# Patient Record
Sex: Male | Born: 1985 | Race: White | Hispanic: No | Marital: Married | Smoking: Former smoker
Health system: Southern US, Community
[De-identification: ages and names within clinical notes are randomized; demographics above are authoritative.]

## PROBLEM LIST (undated history)

## (undated) DIAGNOSIS — N200 Calculus of kidney: Secondary | ICD-10-CM

## (undated) HISTORY — PX: WRIST SURGERY: SHX841

## (undated) HISTORY — DX: Calculus of kidney: N20.0

---

## 2014-01-12 DIAGNOSIS — F411 Generalized anxiety disorder: Secondary | ICD-10-CM | POA: Insufficient documentation

## 2014-10-03 DIAGNOSIS — Z87891 Personal history of nicotine dependence: Secondary | ICD-10-CM | POA: Insufficient documentation

## 2017-08-16 ENCOUNTER — Ambulatory Visit (INDEPENDENT_AMBULATORY_CARE_PROVIDER_SITE_OTHER): Payer: 59 | Admitting: Physician Assistant

## 2017-08-16 ENCOUNTER — Encounter: Payer: Self-pay | Admitting: Physician Assistant

## 2017-08-16 VITALS — BP 128/80 | HR 76 | Temp 98.7°F | Resp 16 | Ht 71.5 in | Wt 248.0 lb

## 2017-08-16 DIAGNOSIS — R221 Localized swelling, mass and lump, neck: Secondary | ICD-10-CM | POA: Diagnosis not present

## 2017-08-16 DIAGNOSIS — Z0000 Encounter for general adult medical examination without abnormal findings: Secondary | ICD-10-CM | POA: Diagnosis not present

## 2017-08-16 DIAGNOSIS — Z23 Encounter for immunization: Secondary | ICD-10-CM | POA: Diagnosis not present

## 2017-08-16 DIAGNOSIS — F411 Generalized anxiety disorder: Secondary | ICD-10-CM | POA: Diagnosis not present

## 2017-08-16 DIAGNOSIS — Z13220 Encounter for screening for lipoid disorders: Secondary | ICD-10-CM | POA: Diagnosis not present

## 2017-08-16 DIAGNOSIS — G8929 Other chronic pain: Secondary | ICD-10-CM | POA: Diagnosis not present

## 2017-08-16 DIAGNOSIS — Z131 Encounter for screening for diabetes mellitus: Secondary | ICD-10-CM

## 2017-08-16 DIAGNOSIS — F17200 Nicotine dependence, unspecified, uncomplicated: Secondary | ICD-10-CM

## 2017-08-16 DIAGNOSIS — Z1329 Encounter for screening for other suspected endocrine disorder: Secondary | ICD-10-CM | POA: Diagnosis not present

## 2017-08-16 DIAGNOSIS — M5440 Lumbago with sciatica, unspecified side: Secondary | ICD-10-CM

## 2017-08-16 MED ORDER — BUPROPION HCL ER (SR) 150 MG PO TB12: ORAL_TABLET | ORAL | 2 refills | Status: DC

## 2017-08-16 NOTE — Patient Instructions (Signed)

## 2017-08-16 NOTE — Progress Notes (Signed)
Patient: Gregory Wells Male    DOB: 23-Jul-1986   31 y.o.   MRN: [id number] Visit Date: 08/16/2017  Today's Provider: Trey Sailors, PA-C   Chief Complaint  Patient presents with  . Establish Care  . Cyst    Right side of neck  . Back Pain    Lower back; worse in the morning. Recent flare about two weeks ago.     Subjective:    Gregory Wells is a 31 y/o man presenting today to establish care. He has not been seen by a PCP.   He lives in [place] with his wife and three children. He works in [employer] and travels frequently for his work. He smokes 8-13 cigarettes per day, has since he was 13. Has quit multiple times in the past, most recently 9 months ago. He uses alcohol. He does not do drugs.   He has had episodes of back pain twice, once every four months. He was working and felt his back kink and felt extreme pain that shot down his legs. It lasted for a while and then subsided. Worse in the mornings and improves with movement. Had normal Xrays per patient two years ago with previous PCP.  Also has noticed cyst on the right side of his neck within the past month. Does not change in size, has not gotten red. It's not painful to touch but he says he'll occasionally feel pain when turning his head. No problems with his arms.   Has been starting to exercise, going to the gym and walking.   Has a history of anxiety that he copes with by smoking. He is interested in quitting smoking and then going on longterm anxiety medication.   Back Pain  This is a recurrent problem. The current episode started more than 1 month ago (First one was in Feb most recently two weeks ago). The problem is unchanged. The pain is present in the lumbar spine. The quality of the pain is described as cramping. Worse during: Worse in the mornings. The symptoms are aggravated by lying down. Stiffness is present in the morning. Pertinent negatives include no abdominal pain, bladder incontinence, bowel  incontinence, chest pain, dysuria, fever, headaches, leg pain, numbness, paresis, paresthesias, pelvic pain, perianal numbness, tingling, weakness or weight loss. He has tried ice and home exercises for the symptoms. The treatment provided no relief.       Allergies  Allergen Reactions  . Penicillins     Other reaction(s): VOMITING    No current outpatient prescriptions on file.  Review of Systems  Constitutional: Negative for fever and weight loss.  Cardiovascular: Negative for chest pain.  Gastrointestinal: Negative for abdominal pain and bowel incontinence.  Genitourinary: Negative for bladder incontinence, dysuria and pelvic pain.  Musculoskeletal: Positive for back pain.  Neurological: Negative for tingling, weakness, numbness, headaches and paresthesias.    Social History  Substance Use Topics  . Smoking status: Current Every Day Smoker    Types: Cigarettes  . Smokeless tobacco: Never Used  . Alcohol use 1.8 oz/week    3 Cans of beer per week   Objective:   BP 128/80 (BP Location: Right Arm, Patient Position: Sitting, Cuff Size: Large)   Pulse 76   Temp 98.7 F (37.1 C) (Oral)   Resp 16   Ht 5' 11.5" (1.816 m)   Wt 248 lb (112.5 kg)   BMI 34.11 kg/m  Vitals:   08/16/17 1515  BP: 128/80  Pulse:  76  Resp: 16  Temp: 98.7 F (37.1 C)  TempSrc: Oral  Weight: 248 lb (112.5 kg)  Height: 5' 11.5" (1.816 m)     Physical Exam  Constitutional: He is oriented to person, place, and time. He appears well-developed and well-nourished. No distress.  HENT:  Right Ear: External ear normal.  Left Ear: External ear normal.  Mouth/Throat: Oropharynx is clear and moist. No oropharyngeal exudate.  Eyes: Conjunctivae are normal.  Neck: Neck supple. No thyromegaly present.    Cardiovascular: Normal rate and regular rhythm.   Pulmonary/Chest: Effort normal and breath sounds normal.  Abdominal: Soft. Bowel sounds are normal.  Lymphadenopathy:    He has no cervical  adenopathy.  Neurological: He is alert and oriented to person, place, and time.  Skin: Skin is warm and dry.  Psychiatric: He has a normal mood and affect. His behavior is normal.        Assessment & Plan:     1. Annual physical exam  - CBC with Differential  2. Neck mass  Do feel neck mass, feels like lipoma. Can observe until follow up, may send to surgery if patient desires further workup.  3. Current smoker  Counseled on picking quit date 2 weeks after starting medications.  - buPROPion (WELLBUTRIN SR) 150 MG 12 hr tablet; Take150 mg tab once daily x 3 days. Then take 150 mg tab twice daily onwards.  Dispense: 60 tablet; Refill: 2  4. Anxiety state   Wants to attempt to quit smoking first and then try long term medication for anxiety. Does not want to talk to anybody about it.  5. Screening cholesterol level  - Lipid Profile  6. Chronic bilateral low back pain with sciatica, sciatica laterality unspecified  Sounds like sciatics. Can do physical therapy. Patient interested in continuing to workout at gym on his own.  7. Thyroid disorder screening  - TSH  8. Need for Tdap vaccination  - Tdap vaccine greater than or equal to 7yo IM  9. Influenza vaccination administered at current visit  - Flu Vaccine QUAD 6+ mos PF IM (Fluarix Quad PF)  10. Diabetes mellitus screening  - Comprehensive Metabolic Panel (CMET)  Return in about 2 months (around 10/16/2017) for smoking, neck mass.  The entirety of the information documented in the History of Present Illness, Review of Systems and Physical Exam were personally obtained by me. Portions of this information were initially documented by Kavin Leech, CMA and reviewed by me for thoroughness and accuracy.         Trey Sailors, PA-C  [facility]

## 2017-09-24 ENCOUNTER — Ambulatory Visit: Payer: 59 | Admitting: Physician Assistant

## 2017-10-01 ENCOUNTER — Ambulatory Visit: Payer: 59 | Admitting: Physician Assistant

## 2017-12-23 ENCOUNTER — Other Ambulatory Visit: Payer: Self-pay

## 2017-12-23 ENCOUNTER — Emergency Department (HOSPITAL_COMMUNITY)
Admission: EM | Admit: 2017-12-23 | Discharge: 2017-12-23 | Disposition: A | Discharge status: Home / Self Care | Payer: 59 | Attending: Emergency Medicine | Admitting: Emergency Medicine

## 2017-12-23 ENCOUNTER — Encounter (HOSPITAL_COMMUNITY): Payer: Self-pay

## 2017-12-23 ENCOUNTER — Emergency Department (HOSPITAL_COMMUNITY): Payer: 59

## 2017-12-23 DIAGNOSIS — R109 Unspecified abdominal pain: Secondary | ICD-10-CM | POA: Diagnosis not present

## 2017-12-23 DIAGNOSIS — Z79899 Other long term (current) drug therapy: Secondary | ICD-10-CM | POA: Diagnosis not present

## 2017-12-23 DIAGNOSIS — N201 Calculus of ureter: Secondary | ICD-10-CM | POA: Diagnosis not present

## 2017-12-23 DIAGNOSIS — N200 Calculus of kidney: Secondary | ICD-10-CM | POA: Diagnosis not present

## 2017-12-23 DIAGNOSIS — R1032 Left lower quadrant pain: Secondary | ICD-10-CM | POA: Diagnosis present

## 2017-12-23 DIAGNOSIS — F17210 Nicotine dependence, cigarettes, uncomplicated: Secondary | ICD-10-CM | POA: Insufficient documentation

## 2017-12-23 LAB — URINALYSIS, ROUTINE W REFLEX MICROSCOPIC
Bilirubin Urine: NEGATIVE
Glucose, UA: NEGATIVE mg/dL
Ketones, ur: NEGATIVE mg/dL
LEUKOCYTES UA: NEGATIVE
NITRITE: NEGATIVE
PROTEIN: 30 mg/dL — AB
SPECIFIC GRAVITY, URINE: 1.028 (ref 1.005–1.030)
pH: 6 (ref 5.0–8.0)

## 2017-12-23 LAB — CBC WITH DIFFERENTIAL/PLATELET
BASOS PCT: 0 %
Basophils Absolute: 0 10*3/uL (ref 0.0–0.1)
EOS PCT: 2 %
Eosinophils Absolute: 0.2 10*3/uL (ref 0.0–0.7)
HCT: 43.6 % (ref 39.0–52.0)
Hemoglobin: 15.4 g/dL (ref 13.0–17.0)
Lymphocytes Relative: 31 %
Lymphs Abs: 2.4 10*3/uL (ref 0.7–4.0)
MCH: 31 pg (ref 26.0–34.0)
MCHC: 35.3 g/dL (ref 30.0–36.0)
MCV: 87.9 fL (ref 78.0–100.0)
MONO ABS: 0.6 10*3/uL (ref 0.1–1.0)
MONOS PCT: 8 %
NEUTROS ABS: 4.7 10*3/uL (ref 1.7–7.7)
Neutrophils Relative %: 59 %
PLATELETS: 265 10*3/uL (ref 150–400)
RBC: 4.96 MIL/uL (ref 4.22–5.81)
RDW: 12.5 % (ref 11.5–15.5)
WBC: 7.9 10*3/uL (ref 4.0–10.5)

## 2017-12-23 LAB — COMPREHENSIVE METABOLIC PANEL
ALBUMIN: 4.5 g/dL (ref 3.5–5.0)
ALK PHOS: 69 U/L (ref 38–126)
ALT: 27 U/L (ref 17–63)
AST: 30 U/L (ref 15–41)
Anion gap: 14 (ref 5–15)
BILIRUBIN TOTAL: 0.8 mg/dL (ref 0.3–1.2)
BUN: 13 mg/dL (ref 6–20)
CALCIUM: 9.7 mg/dL (ref 8.9–10.3)
CO2: 19 mmol/L — ABNORMAL LOW (ref 22–32)
CREATININE: 1.15 mg/dL (ref 0.61–1.24)
Chloride: 107 mmol/L (ref 101–111)
GFR calc Af Amer: >60 mL/min (ref >60)
GLUCOSE: 128 mg/dL — AB (ref 65–99)
POTASSIUM: 4 mmol/L (ref 3.5–5.1)
Sodium: 140 mmol/L (ref 135–145)
TOTAL PROTEIN: 7.4 g/dL (ref 6.5–8.1)

## 2017-12-23 LAB — LIPASE, BLOOD: LIPASE: 34 U/L (ref 11–51)

## 2017-12-23 MED ORDER — SODIUM CHLORIDE 0.9 % IV BOLUS (SEPSIS)
1000.0000 mL | Freq: Once | INTRAVENOUS | Status: AC
  Administered 2017-12-23: 1000 mL via INTRAVENOUS

## 2017-12-23 MED ORDER — OXYCODONE-ACETAMINOPHEN 5-325 MG PO TABS
2.0000 | ORAL_TABLET | Freq: Once | ORAL | Status: AC
  Administered 2017-12-23: 2 via ORAL
  Filled 2017-12-23: qty 2

## 2017-12-23 MED ORDER — TAMSULOSIN HCL 0.4 MG PO CAPS: 0.4000 mg | ORAL_CAPSULE | Freq: Every day | ORAL | 0 refills | Status: DC

## 2017-12-23 MED ORDER — KETOROLAC TROMETHAMINE 30 MG/ML IJ SOLN
30.0000 mg | Freq: Once | INTRAMUSCULAR | Status: AC
  Administered 2017-12-23: 30 mg via INTRAVENOUS
  Filled 2017-12-23: qty 1

## 2017-12-23 MED ORDER — ONDANSETRON 4 MG PO TBDP: 4.0000 mg | ORAL_TABLET | Freq: Three times a day (TID) | ORAL | 0 refills | Status: DC | PRN

## 2017-12-23 MED ORDER — OXYCODONE-ACETAMINOPHEN 5-325 MG PO TABS: 1.0000 | ORAL_TABLET | Freq: Four times a day (QID) | ORAL | 0 refills | Status: DC | PRN

## 2017-12-23 MED ORDER — ONDANSETRON HCL 4 MG/2ML IJ SOLN
4.0000 mg | Freq: Once | INTRAMUSCULAR | Status: AC
  Administered 2017-12-23: 4 mg via INTRAVENOUS
  Filled 2017-12-23: qty 2

## 2017-12-23 NOTE — ED Notes (Signed)
No questions at discharge

## 2017-12-23 NOTE — ED Triage Notes (Signed)
Patient complains of severe flank pain since 0700, states that he became diaphoretic with same, hx of remote stone

## 2017-12-23 NOTE — Discharge Instructions (Signed)
There is evidence of a kidney stone on the left side.  It appears as though it is on its way out.  Some kidney stones can take up to 30 days to pass. Hydration: Hydration is key to helping a kidney stone pass.  Have a goal of half a liter of water every hour or two. Antiinflammatory medications: Take 600 mg of ibuprofen every 6 hours or 440 mg (over the counter dose) to 500 mg (prescription dose) of naproxen every 12 hours for the next 3 days. After this time, these medications may be used as needed for pain. Take these medications with food to avoid upset stomach. Choose only one of these medications, do not take them together. Tylenol: Should you continue to have additional pain while taking the ibuprofen or naproxen, you may add in tylenol as needed. Your daily total maximum amount of tylenol from all sources should be limited to 4000mg /day for persons without liver problems, or 2000mg /day for those with liver problems. Percocet: May take Percocet as needed for severe pain.  Do not drive or perform other dangerous activities while taking the Percocet.  Please note that each pill of Percocet contains 325 mg of Tylenol and the above dosage limits apply. Tamsulosin: This medication is designed to help the stone pass.  Take this medication daily until stone passes. Zofran: Use of Zofran, as needed, for nausea/vomiting. Follow-up: Follow-up with the urologist as soon as possible on this matter.

## 2017-12-23 NOTE — ED Provider Notes (Signed)
[facility] EMERGENCY DEPARTMENT Provider Note   CSN: [id number] Arrival date & time: 12/23/17  0708     History   Chief Complaint Chief Complaint  Patient presents with  . Flank Pain    HPI Gregory Wells is a 32 y.o. male.  HPI   Gregory Wells is a 32 y.o. male, with distant history of kidney stones, presenting to the ED with left lower back pain beginning suddenly this morning on his drive to work.  Pain is sharp, waxing and waning, 10/10, radiating to the left flank.  He has had similar pain with a kidney stone 10 years ago.  Accompanied by nausea and diaphoresis.  He has not tried any therapies for his pain.  Denies fever, vomiting, diarrhea, constipation, abdominal pain, dysuria, difficulty urinating, testicular pain or swelling, known hematuria, or any other complaints.     History reviewed. No pertinent past medical history.  Patient Active Problem List   Diagnosis Date Noted  . Current smoker 10/03/2014  . Anxiety state 01/12/2014    Past Surgical History:  Procedure Laterality Date  . WRIST SURGERY Left    Removed a Cyst       Home Medications    Prior to Admission medications   Medication Sig Start Date End Date Taking? Authorizing Provider  buPROPion (WELLBUTRIN SR) 150 MG 12 hr tablet Take150 mg tab once daily x 3 days. Then take 150 mg tab twice daily onwards. 08/16/17   Trey Sailors, PA-C  ondansetron (ZOFRAN ODT) 4 MG disintegrating tablet Take 1 tablet (4 mg total) by mouth every 8 (eight) hours as needed for nausea or vomiting. 12/23/17   Sia Gabrielsen C, PA-C  oxyCODONE-acetaminophen (PERCOCET/ROXICET) 5-325 MG tablet Take 1-2 tablets by mouth every 6 (six) hours as needed for severe pain. 12/23/17   Damya Comley C, PA-C  tamsulosin (FLOMAX) 0.4 MG CAPS capsule Take 1 capsule (0.4 mg total) by mouth daily. 12/23/17   Nasri Boakye, Hillard Danker, PA-C    Family History Family History  Problem Relation Age of Onset  . Stroke Mother   .  Hypertension Father   . Heart attack Father   . Healthy Sister   . Healthy Brother   . Stroke Paternal Grandfather     Social History Social History   Tobacco Use  . Smoking status: Current Every Day Smoker    Types: Cigarettes  . Smokeless tobacco: Never Used  Substance Use Topics  . Alcohol use: Yes    Alcohol/week: 1.8 oz    Types: 3 Cans of beer per week  . Drug use: Yes    Types: Hashish     Allergies   Penicillins   Review of Systems Review of Systems  Constitutional: Positive for diaphoresis. Negative for chills and fever.  Respiratory: Negative for shortness of breath.   Cardiovascular: Negative for chest pain.  Gastrointestinal: Positive for nausea. Negative for abdominal pain, blood in stool, constipation, diarrhea and vomiting.  Genitourinary: Positive for flank pain. Negative for dysuria.  Musculoskeletal: Positive for back pain.  Neurological: Negative for weakness.  All other systems reviewed and are negative.    Physical Exam Updated Vital Signs BP (!) 133/99   Pulse 73   Temp 97.9 F (36.6 C) (Oral)   Resp 18   SpO2 97%   Physical Exam  Constitutional: He appears well-developed and well-nourished. He appears distressed (pain).  Patient appears uncomfortable, pacing around the room.  HENT:  Head: Normocephalic and atraumatic.  Eyes: Conjunctivae are  normal.  Neck: Neck supple.  Cardiovascular: Normal rate, regular rhythm, normal heart sounds and intact distal pulses.  Pulmonary/Chest: Effort normal and breath sounds normal. No respiratory distress.  Abdominal: Soft. There is no tenderness. There is CVA tenderness (left). There is no guarding.  Musculoskeletal: He exhibits no edema.  Lymphadenopathy:    He has no cervical adenopathy.  Neurological: He is alert.  Skin: Skin is warm and dry. He is not diaphoretic.  Psychiatric: He has a normal mood and affect. His behavior is normal.  Nursing note and vitals reviewed.    ED Treatments /  Results  Labs (all labs ordered are listed, but only abnormal results are displayed) Labs Reviewed  URINALYSIS, ROUTINE W REFLEX MICROSCOPIC - Abnormal; Notable for the following components:      Result Value   Hgb urine dipstick LARGE (*)    Protein, ur 30 (*)    Bacteria, UA FEW (*)    Squamous Epithelial / LPF 0-5 (*)    All other components within normal limits  COMPREHENSIVE METABOLIC PANEL - Abnormal; Notable for the following components:   CO2 19 (*)    Glucose, Bld 128 (*)    All other components within normal limits  LIPASE, BLOOD  CBC WITH DIFFERENTIAL/PLATELET    EKG  EKG Interpretation None       Radiology Ct Renal Stone Study  Result Date: 12/23/2017 CLINICAL DATA:  Left flank pain and nausea beginning this morning. History of urinary tract stones. EXAM: CT ABDOMEN AND PELVIS WITHOUT CONTRAST TECHNIQUE: Multidetector CT imaging of the abdomen and pelvis was performed following the standard protocol without IV contrast. COMPARISON:  None. FINDINGS: Lower chest: Lung bases are clear. No pleural or pericardial effusion. Hepatobiliary: No focal liver abnormality is seen. No gallstones, gallbladder wall thickening, or biliary dilatation. Pancreas: Unremarkable. No pancreatic ductal dilatation or surrounding inflammatory changes. Spleen: Normal in size without focal abnormality. Adrenals/Urinary Tract: There is mild left hydronephrosis with some stranding about the left kidney and ureter due to a punctate stone 1-2 cm proximal to the UVJ. No other urinary tract stones are identified. No focal renal lesion is seen. The adrenal glands and urinary bladder are normal. Stomach/Bowel: Stomach is within normal limits. Appendix appears normal. No evidence of bowel wall thickening, distention, or inflammatory changes. Vascular/Lymphatic: No significant vascular findings are present. No enlarged abdominal or pelvic lymph nodes. Reproductive: Prostate is unremarkable. Other: Small fat  containing umbilical hernia noted.  No ascites. Musculoskeletal: Convex right scoliosis noted. IMPRESSION: Mild left hydronephrosis due to a punctate ureteral stone 1-2 cm proximal to the left UVJ. No other acute abnormality. No other urinary tract stones. Small fat containing umbilical hernia. Electronically Signed   By: Drusilla Kanner M.D.   On: 12/23/2017 10:19    Procedures Procedures (including critical care time)  Medications Ordered in ED Medications  ketorolac (TORADOL) 30 MG/ML injection 30 mg (30 mg Intravenous Given 12/23/17 0819)  ondansetron (ZOFRAN) injection 4 mg (4 mg Intravenous Given 12/23/17 0819)  sodium chloride 0.9 % bolus 1,000 mL (0 mLs Intravenous Stopped 12/23/17 0903)  sodium chloride 0.9 % bolus 1,000 mL (1,000 mLs Intravenous New Bag/Given 12/23/17 1144)  oxyCODONE-acetaminophen (PERCOCET/ROXICET) 5-325 MG per tablet 2 tablet (2 tablets Oral Given 12/23/17 1142)     Initial Impression / Assessment and Plan / ED Course  I have reviewed the triage vital signs and the nursing notes.  Pertinent labs & imaging results that were available during my care of the patient were  reviewed by me and considered in my medical decision making (see chart for details).  Clinical Course as of Dec 23 1212  Thu Dec 23, 2017  [id number] Patient reexamined.  He is now calmly resting on the bed.  States his pain is 2/10.  Denies additional complaints.  [SJ]  1212 Patient voices relief of his pain with the Percocet. Tolerating PO.  [SJ]    Clinical Course User Index [SJ] Bevely Hackbart C, PA-C   Patient presents with left lower back and flank pain.  Kidney stone suspected.  Kidney stone noted on CT in the distal left ureter.  No evidence of infection on UA.  Pain controlled with oral medications. Tolerating PO.  Urology follow-up. The patient was given instructions for home care as well as return precautions. Patient voices understanding of these instructions, accepts the plan, and is comfortable  with discharge.      Final Clinical Impressions(s) / ED Diagnoses   Final diagnoses:  Left flank pain  Left ureteral stone    ED Discharge Orders        Ordered    oxyCODONE-acetaminophen (PERCOCET/ROXICET) 5-325 MG tablet  Every 6 hours PRN     12/23/17 1105    ondansetron (ZOFRAN ODT) 4 MG disintegrating tablet  Every 8 hours PRN     12/23/17 1105    tamsulosin (FLOMAX) 0.4 MG CAPS capsule  Daily     12/23/17 1105       Anselm PancoastJoy, Takeira Yanes C, PA-C 12/23/17 1214    Tilden Fossaees, Elizabeth, MD 12/24/17 [phone]

## 2017-12-23 NOTE — ED Notes (Signed)
Patient transported to CT 

## 2017-12-24 ENCOUNTER — Ambulatory Visit: Payer: 59 | Admitting: Physician Assistant

## 2017-12-24 ENCOUNTER — Encounter: Payer: Self-pay | Admitting: Physician Assistant

## 2017-12-24 VITALS — BP 118/84 | HR 64 | Temp 99.0°F | Resp 16 | Wt 256.0 lb

## 2017-12-24 DIAGNOSIS — N200 Calculus of kidney: Secondary | ICD-10-CM | POA: Diagnosis not present

## 2017-12-24 DIAGNOSIS — Z13220 Encounter for screening for lipoid disorders: Secondary | ICD-10-CM | POA: Diagnosis not present

## 2017-12-24 DIAGNOSIS — Z1329 Encounter for screening for other suspected endocrine disorder: Secondary | ICD-10-CM | POA: Diagnosis not present

## 2017-12-24 DIAGNOSIS — F419 Anxiety disorder, unspecified: Secondary | ICD-10-CM

## 2017-12-24 DIAGNOSIS — R221 Localized swelling, mass and lump, neck: Secondary | ICD-10-CM | POA: Diagnosis not present

## 2017-12-24 MED ORDER — SERTRALINE HCL 50 MG PO TABS: 50.0000 mg | ORAL_TABLET | Freq: Every day | ORAL | 0 refills | Status: DC

## 2017-12-24 NOTE — Patient Instructions (Signed)

## 2017-12-24 NOTE — Progress Notes (Signed)
Patient: Gregory Wells Male    DOB: 03/17/1986   32 y.o.   MRN: [id number] Visit Date: 12/24/2017  Today's Provider: Trey SailorsAdriana M Pollak, PA-C   Chief Complaint  Patient presents with  . Nephrolithiasis  . Panic Attack  . Anxiety   Subjective:    Gregory Wells is a 32 y/o man presenting today for follow up of kidney stones, anxiety, and neck mass.   Recently, he has quit smoking with help of Wellbutrin. Since then his underlying anxiety has increased. He has been dealing with panic attacks and increased anxious feelings. He has previously been on Paxil with poor results and Zoloft with good results. Would like to resume Zoloft.  Also, neck mass noted on previous exam persists without any changes. He would like to pursue removal of this.   Anxiety  Presents for initial visit. Onset was at an unknown time. The problem has been gradually worsening. Symptoms include decreased concentration, excessive worry, insomnia, nervous/anxious behavior (Panic attacks last night. ), obsessions and panic. Patient reports no chest pain, confusion, depressed mood, dizziness, malaise, nausea, palpitations, shortness of breath or suicidal ideas. The quality of sleep is poor.        Follow up ER visit  Patient was seen in ER for Kidney stones on 12/23/2017. CT abdomen pelvis reveals "punctate ureteral stone 1-2 cm proximal to the left UVJ." Treatment for this included Percocet, Flomax, and Zofran. He reports excellent compliance with treatment. He reports this condition is Improved, however he does not feel he passed the stone.  ------------------------------------------------------------------------------------   ------------------------------------------------------------------------------------        Allergies  Allergen Reactions  . Penicillins     Other reaction(s): VOMITING     Current Outpatient Medications:  .  ondansetron (ZOFRAN ODT) 4 MG disintegrating tablet, Take 1  tablet (4 mg total) by mouth every 8 (eight) hours as needed for nausea or vomiting., Disp: 20 tablet, Rfl: 0 .  oxyCODONE-acetaminophen (PERCOCET/ROXICET) 5-325 MG tablet, Take 1-2 tablets by mouth every 6 (six) hours as needed for severe pain., Disp: 20 tablet, Rfl: 0 .  tamsulosin (FLOMAX) 0.4 MG CAPS capsule, Take 1 capsule (0.4 mg total) by mouth daily., Disp: 30 capsule, Rfl: 0 .  buPROPion (WELLBUTRIN SR) 150 MG 12 hr tablet, Take150 mg tab once daily x 3 days. Then take 150 mg tab twice daily onwards. (Patient not taking: Reported on 12/24/2017), Disp: 60 tablet, Rfl: 2  Review of Systems  Constitutional: Positive for chills (Chills last night). Negative for activity change, appetite change, diaphoresis, fatigue, fever and unexpected weight change.  Respiratory: Negative for shortness of breath.   Cardiovascular: Negative for chest pain and palpitations.  Gastrointestinal: Negative for nausea.  Genitourinary: Negative.  Negative for decreased urine volume, difficulty urinating, discharge, dysuria, enuresis, flank pain, frequency, genital sores, hematuria, penile pain, penile swelling, scrotal swelling, testicular pain and urgency.  Neurological: Negative for dizziness, light-headedness and headaches.  Psychiatric/Behavioral: Positive for decreased concentration and sleep disturbance. Negative for agitation, confusion, dysphoric mood, hallucinations, self-injury and suicidal ideas. The patient is nervous/anxious (Panic attacks last night. ) and has insomnia. The patient is not hyperactive.     Social History   Tobacco Use  . Smoking status: Former Smoker    Types: Cigarettes  . Smokeless tobacco: Never Used  Substance Use Topics  . Alcohol use: Yes    Alcohol/week: 1.8 oz    Types: 3 Cans of beer per week   Objective:   Wt  256 lb (116.1 kg)   BMI 35.21 kg/m  Vitals:   12/24/17 1332  Weight: 256 lb (116.1 kg)     Physical Exam  Constitutional: He is oriented to person,  place, and time. He appears well-developed and well-nourished.  Neck:  Mass present on posterior right neck.   Cardiovascular: Normal rate and regular rhythm.  Pulmonary/Chest: Effort normal and breath sounds normal.  Abdominal: Soft. Bowel sounds are normal.  Neurological: He is alert and oriented to person, place, and time.  Skin: Skin is warm and dry.  Psychiatric: He has a normal mood and affect. His behavior is normal.        Assessment & Plan:     1. Anxiety  Counseled on side effects, expected duration until effects are felt. Follow up 1 mo.  - sertraline (ZOLOFT) 50 MG tablet; Take 1 tablet (50 mg total) by mouth daily.  Dispense: 90 tablet; Refill: 0  2. Kidney stone  Provided strainer and referral to urology. Continue Flomax, percocet, and zofran PRN. If he does not pass stone before urology referral, will provide with more pain medications.  - sertraline (ZOLOFT) 50 MG tablet; Take 1 tablet (50 mg total) by mouth daily.  Dispense: 90 tablet; Refill: 0 - Ambulatory referral to Urology  3. Thyroid disorder screen  - TSH  4. Screening cholesterol level  - Lipid Profile  5. Neck mass  -referral to general surgery  Return in about 1 month (around 01/21/2018) for anxiety.  The entirety of the information documented in the History of Present Illness, Review of Systems and Physical Exam were personally obtained by me. Portions of this information were initially documented by Kavin Leech, CMA and reviewed by me for thoroughness and accuracy.          Trey Sailors, PA-C  [facility]

## 2017-12-25 LAB — LIPID PANEL
Chol/HDL Ratio: 5 ratio (ref 0.0–5.0)
Cholesterol, Total: 174 mg/dL (ref 100–199)
HDL: 35 mg/dL — ABNORMAL LOW (ref >39)
LDL Calculated: 99 mg/dL (ref 0–99)
Triglycerides: 201 mg/dL — ABNORMAL HIGH (ref 0–149)
VLDL Cholesterol Cal: 40 mg/dL (ref 5–40)

## 2017-12-25 LAB — TSH: TSH: 1.17 u[IU]/mL (ref 0.450–4.500)

## 2017-12-27 ENCOUNTER — Encounter: Payer: Self-pay | Admitting: *Deleted

## 2017-12-28 ENCOUNTER — Telehealth: Payer: Self-pay

## 2017-12-28 NOTE — Telephone Encounter (Signed)
Pt advised.   -[name]

## 2017-12-28 NOTE — Telephone Encounter (Signed)
-----   Message from Trey SailorsAdriana M Pollak, [place]PA-C sent at 12/27/2017  2:10 PM EST ----- TSH normal. Some low HDL, otherwise cholesterol is normal. Would recommend increasing exercise to 30 min 5x weekly. Have placed referral to general surgery to evaluate neck mass.

## 2018-01-12 ENCOUNTER — Ambulatory Visit: Payer: 59 | Admitting: Urology

## 2018-01-12 ENCOUNTER — Encounter: Payer: Self-pay | Admitting: Urology

## 2018-01-18 ENCOUNTER — Ambulatory Visit: Payer: Self-pay | Admitting: General Surgery

## 2018-01-19 ENCOUNTER — Encounter: Payer: Self-pay | Admitting: General Surgery

## 2018-01-19 ENCOUNTER — Ambulatory Visit: Payer: 59 | Admitting: General Surgery

## 2018-01-19 VITALS — BP 128/78 | HR 88 | Resp 14 | Ht 71.5 in | Wt 254.0 lb

## 2018-01-19 DIAGNOSIS — R220 Localized swelling, mass and lump, head: Secondary | ICD-10-CM | POA: Diagnosis not present

## 2018-01-19 NOTE — Patient Instructions (Signed)
The patient is aware to call back for any questions or concerns.  

## 2018-01-19 NOTE — Progress Notes (Signed)
Patient ID: Gregory Wells, male   DOB: 06/26/1986, 32 y.o.   MRN: [id number]  Chief Complaint  Patient presents with  . Mass    HPI Gregory Haberatrick Abdo is a 32 y.o. male.  Here for evaluation of a right neck posterior mass referred by Osvaldo AngstAdriana Pollak PA. He states it has been there for 1 year. He states it has not gotten a little larger but it is causing some discomfort "shooting pains" .  He went to the ED on 12-23-17 and is taking Zofran, oxycodone and Flomax for a kidney stone. 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   HPI  Past Medical History:  Diagnosis Date  . Kidney stone     Past Surgical History:  Procedure Laterality Date  . WRIST SURGERY Left    Removed a Cyst    Family History  Problem Relation Age of Onset  . Stroke Mother   . Hypertension Father   . Heart attack Father   . Healthy Sister   . Healthy Brother   . Stroke Paternal Grandfather   . Colon cancer Neg Hx     Social History Social History   Tobacco Use  . Smoking status: Former Smoker    Years: 15.00    Types: Cigarettes    Last attempt to quit: 10/09/2017    Years since quitting: 0.2  . Smokeless tobacco: Never Used  Substance Use Topics  . Alcohol use: Yes    Alcohol/week: 1.8 oz    Types: 3 Cans of beer per week  . Drug use: No    Allergies  Allergen Reactions  . Penicillins     Other reaction(s): VOMITING    Current Outpatient Medications  Medication Sig Dispense Refill  . ondansetron (ZOFRAN ODT) 4 MG disintegrating tablet Take 1 tablet (4 mg total) by mouth every 8 (eight) hours as needed for nausea or vomiting. 20 tablet 0  . oxyCODONE-acetaminophen (PERCOCET/ROXICET) 5-325 MG tablet Take 1-2 tablets by mouth every 6 (six) hours as needed for severe pain. 20 tablet 0  . sertraline (ZOLOFT) 50 MG tablet Take 1 tablet (50 mg total) by mouth daily. 90 tablet 0  . tamsulosin (FLOMAX) 0.4 MG CAPS capsule Take 1 capsule (0.4 mg total) by mouth daily. 30 capsule 0   No current  facility-administered medications for this visit.     Review of Systems Review of Systems  Constitutional: Negative.   Respiratory: Negative.   Cardiovascular: Negative.     Blood pressure 128/78, pulse 88, resp. rate 14, height 5' 11.5" (1.816 m), weight 254 lb (115.2 kg).  Physical Exam Physical Exam  Constitutional: He is oriented to person, place, and time. He appears well-developed and well-nourished.  Neurological: He is alert and oriented to person, place, and time.  Skin: Skin is warm and dry.  2.5 cm non inflamed sebaceous cyst or lipoma right posterior occipital area.  Psychiatric: His behavior is normal.    Data Reviewed The patient desired excision.  Procedure was reviewed.  Hair was trimmed carefully to rule out apply application of a bandage.  The area was cleansed with ChloraPrep followed by 10 cc of 0.5% Xylocaine with 0.25% Marcaine with 1-200,000 units of epinephrine.  This was well-tolerated.  A transverse incision was made over the lesion.  A multilobulated soft tissue mass consistent with a lipoma was excised.  Hemostasis was with 3-0 Vicryl figure-of-eight suture.  The deep tissue was approximated with interrupted 3-0 Vicryl sutures.  The skin was closed with  a running 4-0 Prolene running suture.  Telfa and Tegaderm dressing applied.  Assessment    Lipoma of the right occipital scalp.    Plan    The patient will follow up in 1 week for suture removal.     HPI, Physical Exam, Assessment and Plan have been scribed under the direction and in the presence of Earline Mayotte, MD. Dorathy Daft, RN  I have completed the exam and reviewed the above documentation for accuracy and completeness.  I agree with the above.  Museum/gallery conservator has been used and any errors in dictation or transcription are unintentional.  Donnalee Curry, M.D., F.A.C.S.  Merrily Pew Boris Engelmann 01/19/2018, 8:10 PM

## 2018-01-25 ENCOUNTER — Telehealth: Payer: Self-pay | Admitting: *Deleted

## 2018-01-25 NOTE — Telephone Encounter (Signed)
-----   Message from Earline MayotteJeffrey W Byrnett, MD sent at 01/25/2018 12:12 PM EDT ----- Please let the patient know that the pathology was fine, simply fatty tissue. ----- Message ----- From: Interface, Lab In [facility] Sent: 01/24/2018   3:52 PM To: Earline MayotteJeffrey W Byrnett, MD

## 2018-01-25 NOTE — Telephone Encounter (Signed)
Notified patient as instructed, patient agrees. Patient will follow up with a nurse on Wednesday for suture removal

## 2018-01-26 ENCOUNTER — Ambulatory Visit (INDEPENDENT_AMBULATORY_CARE_PROVIDER_SITE_OTHER): Payer: 59 | Admitting: *Deleted

## 2018-01-26 DIAGNOSIS — R220 Localized swelling, mass and lump, head: Secondary | ICD-10-CM

## 2018-01-26 NOTE — Patient Instructions (Signed)
The patient is aware to call back for any questions or concerns.  

## 2018-01-26 NOTE — Progress Notes (Signed)
Patient ID: Gregory Wells, male   DOB: 06/26/1986, 32 y.o.   MRN: [id number]   Patient came in today for a wound check/suture removal.  The wound is clean, with no signs of infection noted. The sutures were removed and steri strips applied.  Aware of pathology. Follow up as scheduled.

## 2018-08-23 IMAGING — CT CT RENAL STONE PROTOCOL
2 of 4 series · 16 of 46 positions shown, 18 images · non-contrast
Comparison: None.

CLINICAL DATA: Left flank pain and nausea beginning this morning.
History of urinary tract stones.

EXAM:
CT ABDOMEN AND PELVIS WITHOUT CONTRAST
TECHNIQUE: Multidetector CT imaging of the abdomen and pelvis was performed
following the standard protocol without IV contrast.

[Series 3: stone study 5.0 i30f 2 · axial · 0.90mm/px · z∈[+693,+1163]mm · 13 of 104 slices shown, 15 images]
[im 5/104  soft-tissue]
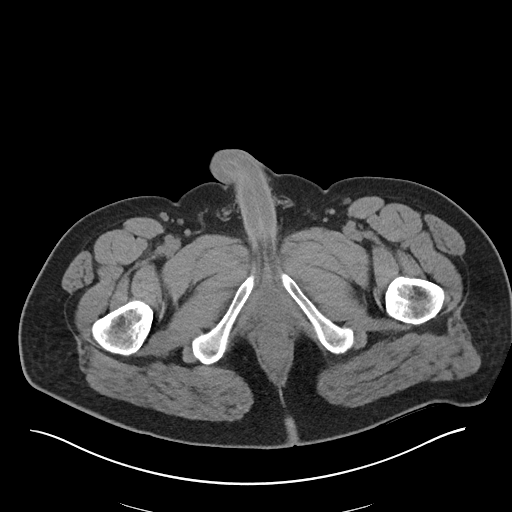
[im 5/104  bone]
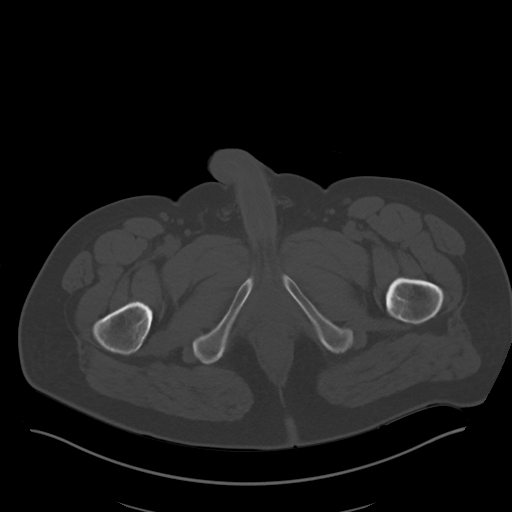
[im 13/104  soft-tissue]
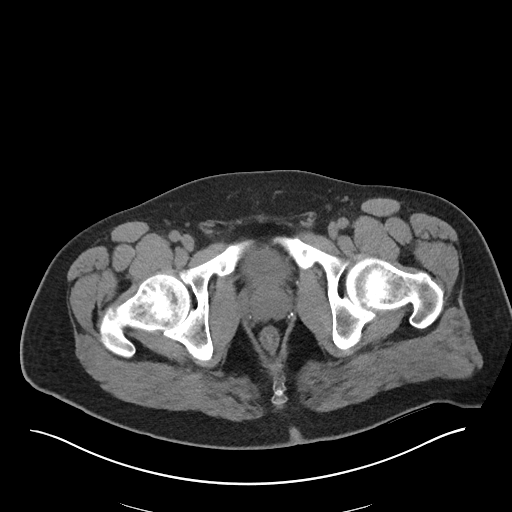
[im 22/104  soft-tissue]
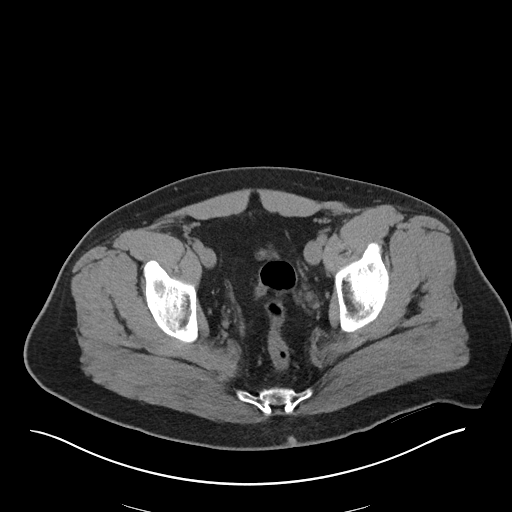
[im 31/104  soft-tissue]
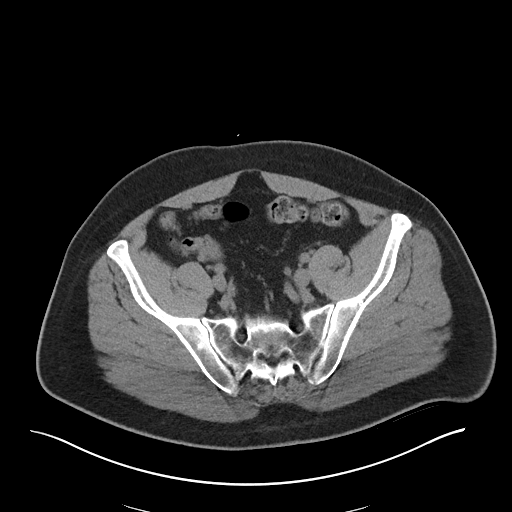
[im 35/104  soft-tissue]
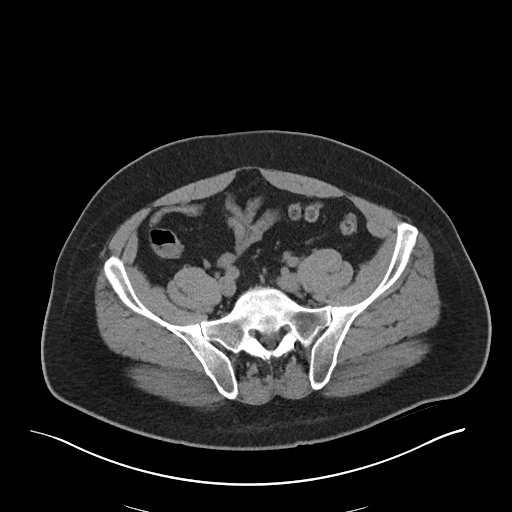
[im 43/104  soft-tissue]
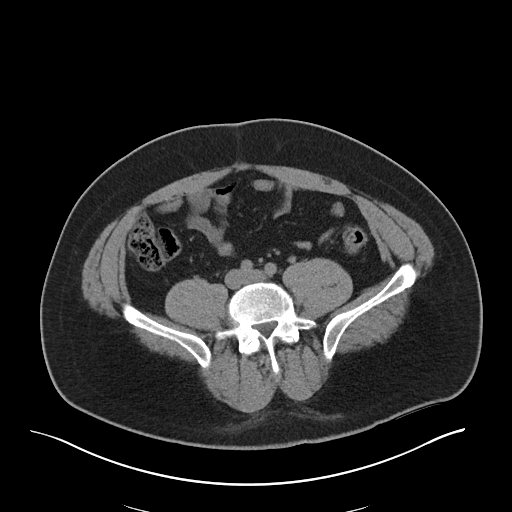
[im 52/104  soft-tissue]
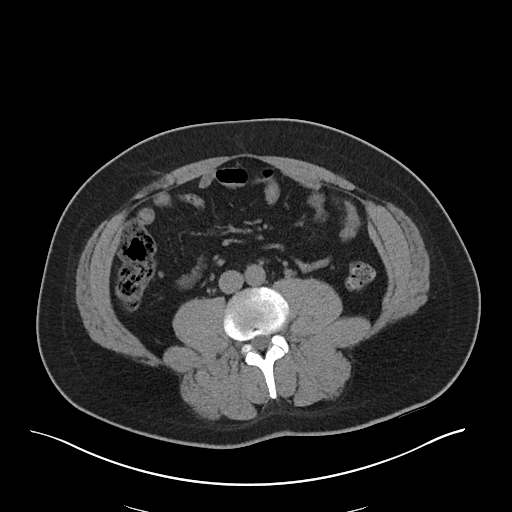
[im 61/104  soft-tissue]
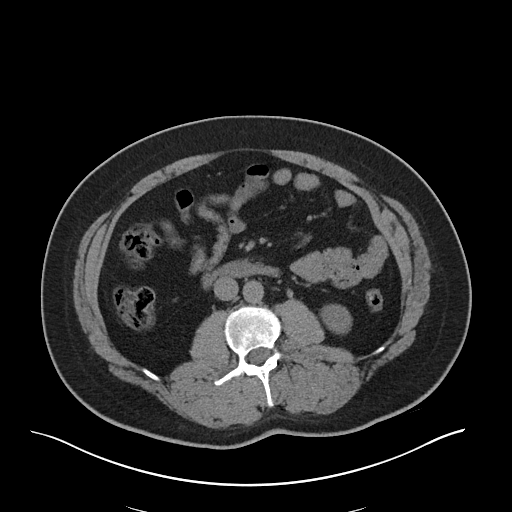
[im 69/104  soft-tissue]
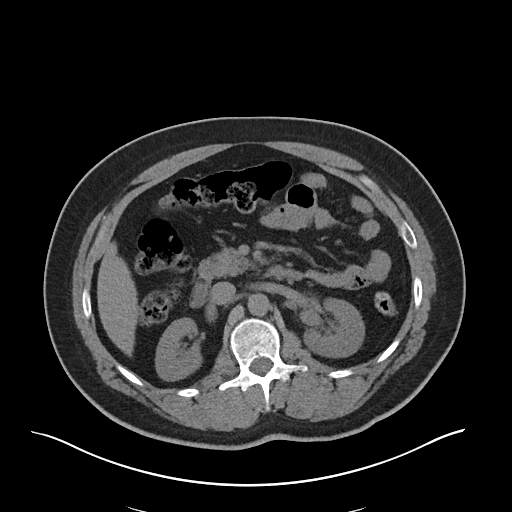
[im 69/104  bone]
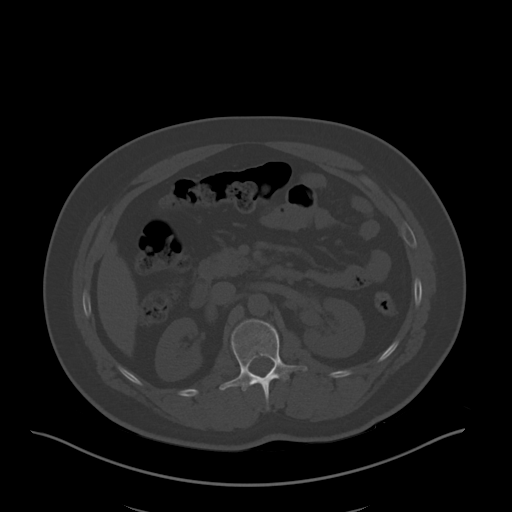
[im 73/104  soft-tissue]
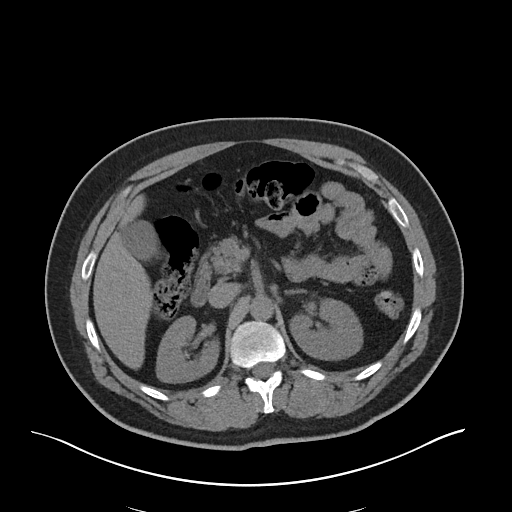
[im 82/104  soft-tissue]
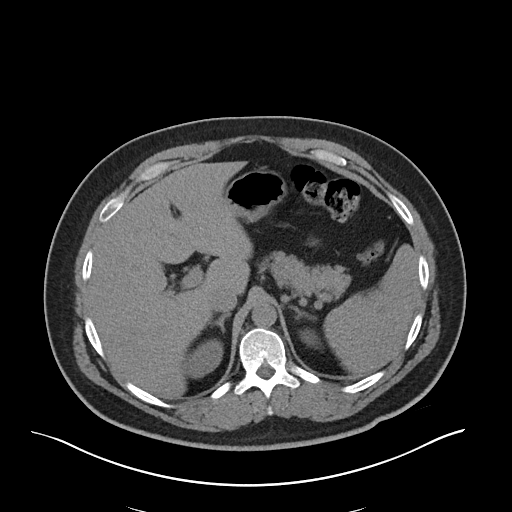
[im 91/104  soft-tissue]
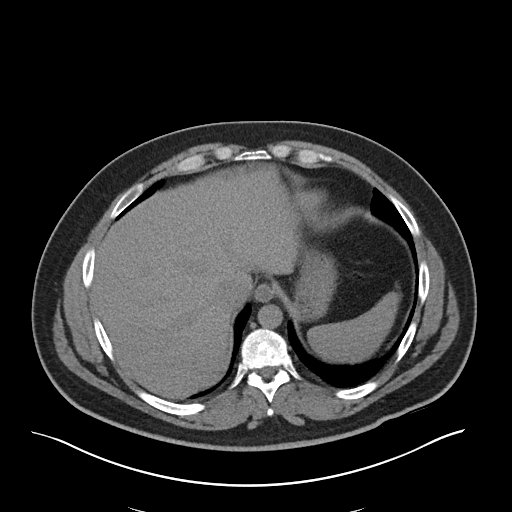
[im 99/104  soft-tissue]
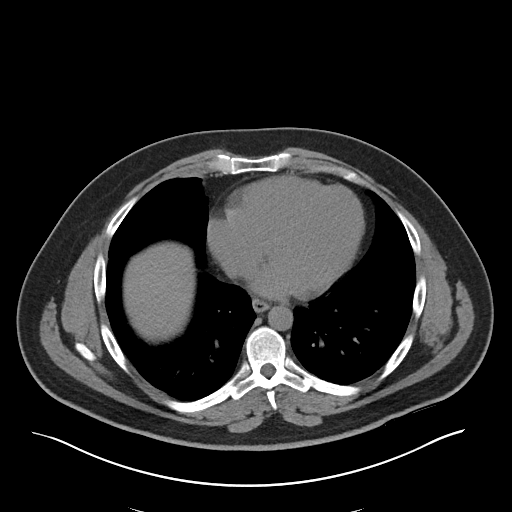

[Series 6: coronal soft tissue · coronal · 0.85mm/px · 3 of 103 slices shown]
[im 35/103  soft-tissue]
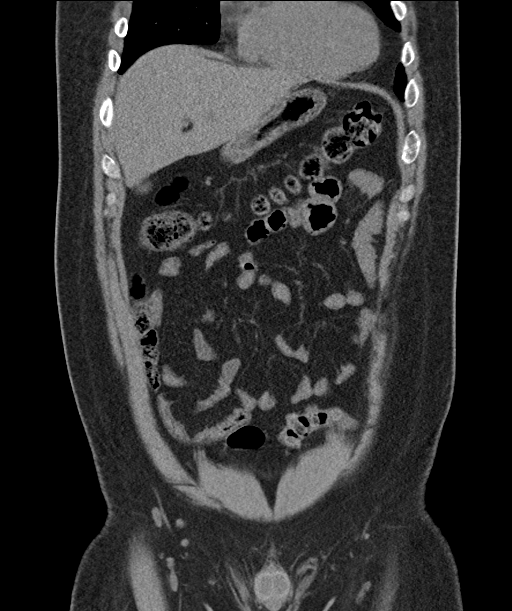
[im 46/103  soft-tissue]
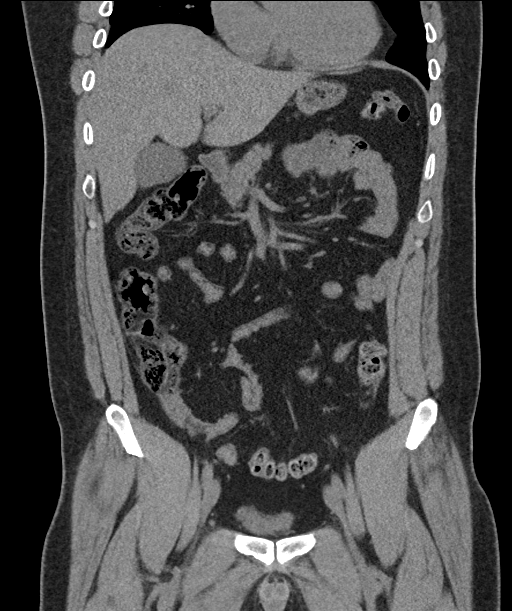
[im 57/103  soft-tissue]
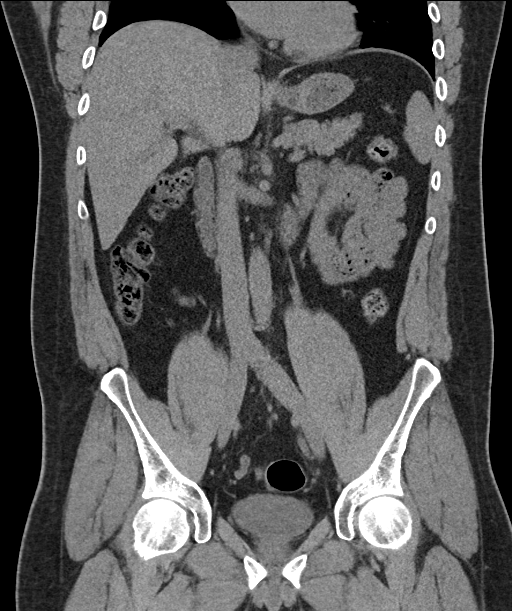

[16 of 46 positions shown; findings below may reference images not displayed]

FINDINGS: Lower chest: Lung bases are clear. No pleural or pericardial
effusion.

Hepatobiliary: No focal liver abnormality is seen. No gallstones,
gallbladder wall thickening, or biliary dilatation.

Pancreas: Unremarkable. No pancreatic ductal dilatation or
surrounding inflammatory changes.

Spleen: Normal in size without focal abnormality.

Adrenals/Urinary Tract: There is mild left hydronephrosis with some
stranding about the left kidney and ureter due to a punctate stone
1-2 cm proximal to the UVJ. No other urinary tract stones are
identified. No focal renal lesion is seen. The adrenal glands and
urinary bladder are normal.

Stomach/Bowel: Stomach is within normal limits. Appendix appears
normal. No evidence of bowel wall thickening, distention, or
inflammatory changes.

Vascular/Lymphatic: No significant vascular findings are present. No
enlarged abdominal or pelvic lymph nodes.

Reproductive: Prostate is unremarkable.

Other: Small fat containing umbilical hernia noted.  No ascites.

Musculoskeletal: Convex right scoliosis noted.
IMPRESSION: Mild left hydronephrosis due to a punctate ureteral stone 1-2 cm
proximal to the left UVJ. No other acute abnormality. No other
urinary tract stones.

Small fat containing umbilical hernia.

## 2018-11-17 ENCOUNTER — Encounter: Payer: Self-pay | Admitting: Family Medicine

## 2018-11-17 ENCOUNTER — Ambulatory Visit: Payer: 59 | Admitting: Family Medicine

## 2018-11-17 VITALS — BP 120/70 | HR 84 | Temp 98.4°F | Resp 16 | Wt 253.0 lb

## 2018-11-17 DIAGNOSIS — H6502 Acute serous otitis media, left ear: Secondary | ICD-10-CM | POA: Diagnosis not present

## 2018-11-17 DIAGNOSIS — J0100 Acute maxillary sinusitis, unspecified: Secondary | ICD-10-CM | POA: Diagnosis not present

## 2018-11-17 MED ORDER — AZITHROMYCIN 250 MG PO TABS: ORAL_TABLET | ORAL | 0 refills | Status: DC

## 2018-11-17 NOTE — Progress Notes (Signed)
Patient: Gregory Wells Male    DOB: 01/13/1986   33 y.o.   MRN: [id number] Visit Date: 11/17/2018  Today's Provider: Dortha Kernennis Arlisa Leclere, PA   Chief Complaint  Patient presents with  . Otalgia   Subjective:     Otalgia   There is pain in both ears. This is a new (Mainly on the left side for the past week.) problem. The current episode started 1 to 4 weeks ago. The problem occurs constantly. The problem has been gradually worsening. There has been no fever. Associated symptoms include headaches, hearing loss (" a little"), neck pain, rhinorrhea and a sore throat. Pertinent negatives include no coughing, diarrhea, ear discharge or vomiting. Associated symptoms comments: A little of Vertigo. He has tried ear drops (equate IBU ear drops for the pain) for the symptoms. The treatment provided no relief.    Past Medical History:  Diagnosis Date  . Kidney stone    Past Surgical History:  Procedure Laterality Date  . WRIST SURGERY Left    Removed a Cyst   Family History  Problem Relation Age of Onset  . Stroke Mother   . Hypertension Father   . Heart attack Father   . Healthy Sister   . Healthy Brother   . Stroke Paternal Grandfather   . Colon cancer Neg Hx    Allergies  Allergen Reactions  . Penicillins     Other reaction(s): VOMITING    Current Outpatient Medications:  .  ondansetron (ZOFRAN ODT) 4 MG disintegrating tablet, Take 1 tablet (4 mg total) by mouth every 8 (eight) hours as needed for nausea or vomiting. (Patient not taking: Reported on 11/17/2018), Disp: 20 tablet, Rfl: 0 .  oxyCODONE-acetaminophen (PERCOCET/ROXICET) 5-325 MG tablet, Take 1-2 tablets by mouth every 6 (six) hours as needed for severe pain. (Patient not taking: Reported on 11/17/2018), Disp: 20 tablet, Rfl: 0 .  sertraline (ZOLOFT) 50 MG tablet, Take 1 tablet (50 mg total) by mouth daily., Disp: 90 tablet, Rfl: 0 .  tamsulosin (FLOMAX) 0.4 MG CAPS capsule, Take 1 capsule (0.4 mg total) by mouth daily.  (Patient not taking: Reported on 11/17/2018), Disp: 30 capsule, Rfl: 0  Review of Systems  Constitutional: Positive for chills. Negative for fatigue and fever.  HENT: Positive for congestion, ear pain, hearing loss (" a little"), rhinorrhea, sinus pressure, sneezing, sore throat and trouble swallowing. Negative for ear discharge, postnasal drip and voice change.   Eyes: Positive for discharge ("Left side").  Respiratory: Positive for wheezing (" a little" in the AM and bedtime). Negative for cough, chest tightness and shortness of breath.   Cardiovascular: Negative for chest pain, palpitations and leg swelling.  Gastrointestinal: Negative for diarrhea, nausea and vomiting.  Musculoskeletal: Positive for neck pain.  Neurological: Positive for headaches.   Social History   Tobacco Use  . Smoking status: Former Smoker    Years: 15.00    Types: Cigarettes    Last attempt to quit: 10/09/2017    Years since quitting: 1.1  . Smokeless tobacco: Never Used  Substance Use Topics  . Alcohol use: Yes    Alcohol/week: 3.0 standard drinks    Types: 3 Cans of beer per week     Objective:   BP 120/70 (BP Location: Left Arm, Patient Position: Sitting, Cuff Size: Large)   Pulse 84   Temp 98.4 F (36.9 C) (Oral)   Resp 16   Wt 253 lb (114.8 kg)   SpO2 98%   BMI 34.79  kg/m  Vitals:   11/17/18 1557  BP: 120/70  Pulse: 84  Resp: 16  Temp: 98.4 F (36.9 C)  TempSrc: Oral  SpO2: 98%  Weight: 253 lb (114.8 kg)   Physical Exam Constitutional:      General: He is not in acute distress.    Appearance: He is well-developed.  HENT:     Head: Normocephalic and atraumatic.     Comments: Poor transillumination of maxillary sinuses.    Right Ear: Hearing, tympanic membrane and ear canal normal.     Left Ear: Hearing normal.     Ears:     Comments: Slight red streak to the left TM and retracted.    Nose: Nose normal.     Mouth/Throat:     Pharynx: Oropharynx is clear.  Eyes:     General:  Lids are normal. No scleral icterus.       Right eye: No discharge.        Left eye: No discharge.     Conjunctiva/sclera: Conjunctivae normal.  Neck:     Musculoskeletal: Neck supple.  Cardiovascular:     Rate and Rhythm: Normal rate and regular rhythm.     Heart sounds: Normal heart sounds.  Pulmonary:     Effort: Pulmonary effort is normal. No respiratory distress.  Abdominal:     General: Bowel sounds are normal.     Palpations: Abdomen is soft.  Musculoskeletal: Normal range of motion.  Skin:    Findings: No lesion or rash.  Neurological:     Mental Status: He is alert and oriented to person, place, and time.  Psychiatric:        Speech: Speech normal.        Behavior: Behavior normal.        Thought Content: Thought content normal.       Assessment & Plan    1. Acute serous otitis media of left ear, recurrence not specified Onset with congestion over the past week. No fever but hearing a little diminished (working in a loud environment recently - [employer]). Retracted left TM with slight red streak without fluid bubbles. Will treat with steroid nasal spray and antibiotic. May use Tylenol prn and recheck prn. - azithromycin (ZITHROMAX) 250 MG tablet; Take 2 tablets by mouth today then one daily for 4 days.  Dispense: 6 tablet; Refill: 0  2. Subacute maxillary sinusitis Some sinus pressure and left upper teeth aching. Poor transillumination of maxillary sinuses. Treat with Z-pak and Flonase Nasal Spray. May use Mucinex-D and recheck if no better in 5-7 days. - azithromycin (ZITHROMAX) 250 MG tablet; Take 2 tablets by mouth today then one daily for 4 days.  Dispense: 6 tablet; Refill: 0     Dortha Kern, PA  [facility]

## 2019-02-15 ENCOUNTER — Telehealth: Payer: Self-pay | Admitting: Physician Assistant

## 2019-02-15 NOTE — Telephone Encounter (Signed)
Pt called saying he has been having ear pain, some cough, allergy like symptoms but did have a fever over 100 on Monday.    Please advise  CB# [phone]   Thanks Gregory Wells

## 2019-02-15 NOTE — Telephone Encounter (Signed)
Please advise 

## 2019-02-15 NOTE — Telephone Encounter (Signed)
Can do e visit or be seen at urgent care.

## 2019-02-16 ENCOUNTER — Ambulatory Visit (INDEPENDENT_AMBULATORY_CARE_PROVIDER_SITE_OTHER): Payer: 59 | Admitting: Physician Assistant

## 2019-02-16 ENCOUNTER — Encounter: Payer: Self-pay | Admitting: Physician Assistant

## 2019-02-16 DIAGNOSIS — R0981 Nasal congestion: Secondary | ICD-10-CM | POA: Diagnosis not present

## 2019-02-16 DIAGNOSIS — H9201 Otalgia, right ear: Secondary | ICD-10-CM | POA: Diagnosis not present

## 2019-02-16 MED ORDER — AZITHROMYCIN 250 MG PO TABS: ORAL_TABLET | ORAL | 0 refills | Status: AC

## 2019-02-16 NOTE — Patient Instructions (Signed)

## 2019-02-16 NOTE — Progress Notes (Signed)
       Patient: Gregory Wells Male    DOB: May 27, 1986   33 y.o.   MRN: [id number] Visit Date: 02/16/2019  Today's Provider: Trey Sailors, PA-C   Chief Complaint  Patient presents with  . Ear Problem   Subjective:     HPI Patient c/o right ear pain since Saturday. Patient denies any discharge from ear. Patient reports allergies and is taking Zyrtec. No flonase, no sudafed. No itchy watery eyes, Sunday slight temperature 99.19F. A little coughing  Has taken equate pain relief. No fevers since. No nausea or vomiting. Symptoms have actually improved. Has some intermittent right sided ear pain.   Allergies  Allergen Reactions  . Penicillins     Other reaction(s): VOMITING     Current Outpatient Medications:  .  sertraline (ZOLOFT) 50 MG tablet, Take 1 tablet (50 mg total) by mouth daily., Disp: 90 tablet, Rfl: 0  Review of Systems  HENT: Positive for ear pain.   Allergic/Immunologic: Positive for environmental allergies.    Social History   Tobacco Use  . Smoking status: Former Smoker    Years: 15.00    Types: Cigarettes    Last attempt to quit: 10/09/2017    Years since quitting: 1.3  . Smokeless tobacco: Never Used  Substance Use Topics  . Alcohol use: Yes    Alcohol/week: 3.0 standard drinks    Types: 3 Cans of beer per week      Objective:   There were no vitals taken for this visit. There were no vitals filed for this visit.   Physical Exam Constitutional:      Appearance: Normal appearance.  Pulmonary:     Effort: Pulmonary effort is normal. No respiratory distress.  Neurological:     Mental Status: He is alert.  Psychiatric:        Mood and Affect: Mood normal.        Behavior: Behavior normal.         Assessment & Plan    1. Ear pain, right  Suspect viral illness, encouraged to wait 5 more days and use allergy medication + flonase and sudafed. If pain persists and sinus congestion worsen, can take abx as below.  - azithromycin (ZITHROMAX)  250 MG tablet; Take 2 pills on day, 1 pill on each of the following four days.  Dispense: 6 tablet; Refill: 0  2. Sinus congestion  - azithromycin (ZITHROMAX) 250 MG tablet; Take 2 pills on day, 1 pill on each of the following four days.  Dispense: 6 tablet; Refill: 0  The entirety of the information documented in the History of Present Illness, Review of Systems and Physical Exam were personally obtained by me. Portions of this information were initially documented by Hetty Ely, CMA and reviewed by me for thoroughness and accuracy.   F/u PRN.       Trey Sailors, PA-C  [facility]

## 2019-06-03 ENCOUNTER — Encounter: Payer: Self-pay | Admitting: Physician Assistant
